# Patient Record
Sex: Male | Born: 1950 | Race: White | Hispanic: No | Marital: Married
Health system: Southern US, Community
[De-identification: ages and names within clinical notes are randomized; demographics above are authoritative.]

---

## 2016-02-23 ENCOUNTER — Encounter (HOSPITAL_COMMUNITY): Payer: Self-pay | Admitting: *Deleted

## 2016-02-23 ENCOUNTER — Emergency Department (HOSPITAL_COMMUNITY): Payer: BLUE CROSS/BLUE SHIELD

## 2016-02-23 ENCOUNTER — Emergency Department (HOSPITAL_COMMUNITY)
Admission: EM | Admit: 2016-02-23 | Discharge: 2016-02-23 | Disposition: A | Discharge status: Home / Self Care | Payer: BLUE CROSS/BLUE SHIELD | Attending: Emergency Medicine | Admitting: Emergency Medicine

## 2016-02-23 DIAGNOSIS — R079 Chest pain, unspecified: Secondary | ICD-10-CM | POA: Insufficient documentation

## 2016-02-23 DIAGNOSIS — Z9861 Coronary angioplasty status: Secondary | ICD-10-CM | POA: Insufficient documentation

## 2016-02-23 DIAGNOSIS — K5900 Constipation, unspecified: Secondary | ICD-10-CM | POA: Insufficient documentation

## 2016-02-23 DIAGNOSIS — R0989 Other specified symptoms and signs involving the circulatory and respiratory systems: Secondary | ICD-10-CM | POA: Insufficient documentation

## 2016-02-23 DIAGNOSIS — E119 Type 2 diabetes mellitus without complications: Secondary | ICD-10-CM | POA: Insufficient documentation

## 2016-02-23 DIAGNOSIS — E875 Hyperkalemia: Secondary | ICD-10-CM

## 2016-02-23 DIAGNOSIS — I10 Essential (primary) hypertension: Secondary | ICD-10-CM

## 2016-02-23 DIAGNOSIS — R109 Unspecified abdominal pain: Secondary | ICD-10-CM | POA: Insufficient documentation

## 2016-02-23 LAB — CBC
HCT: 40.8 % (ref 39.0–52.0)
Hemoglobin: 13.9 g/dL (ref 13.0–17.0)
MCH: 27.4 pg (ref 26.0–34.0)
MCHC: 34.1 g/dL (ref 30.0–36.0)
MCV: 80.3 fL (ref 78.0–100.0)
PLATELETS: 233 10*3/uL (ref 150–400)
RBC: 5.08 MIL/uL (ref 4.22–5.81)
RDW: 12.8 % (ref 11.5–15.5)
WBC: 10.1 10*3/uL (ref 4.0–10.5)

## 2016-02-23 LAB — BASIC METABOLIC PANEL
Anion gap: 13 (ref 5–15)
BUN: 26 mg/dL — ABNORMAL HIGH (ref 6–20)
CHLORIDE: 104 mmol/L (ref 101–111)
CO2: 22 mmol/L (ref 22–32)
CREATININE: 1.45 mg/dL — AB (ref 0.61–1.24)
Calcium: 9 mg/dL (ref 8.9–10.3)
GFR, EST AFRICAN AMERICAN: 58 mL/min — AB (ref >60)
GFR, EST NON AFRICAN AMERICAN: 50 mL/min — AB (ref >60)
Glucose, Bld: 141 mg/dL — ABNORMAL HIGH (ref 65–99)
POTASSIUM: 5.2 mmol/L — AB (ref 3.5–5.1)
SODIUM: 139 mmol/L (ref 135–145)

## 2016-02-23 LAB — I-STAT TROPONIN, ED: Troponin i, poc: 0 ng/mL (ref 0.00–0.08)

## 2016-02-23 MED ORDER — GI COCKTAIL ~~LOC~~
30.0000 mL | Freq: Once | ORAL | Status: AC
  Administered 2016-02-23: 30 mL via ORAL
  Filled 2016-02-23: qty 30

## 2016-02-23 MED ORDER — NITROGLYCERIN 0.4 MG SL SUBL: 0.4000 mg | SUBLINGUAL_TABLET | SUBLINGUAL | Status: AC | PRN

## 2016-02-23 NOTE — ED Notes (Signed)
Pt has a hx of HTN and states he intermittently becomes hypertensive. Pt has a reddened face, states he did take his medications today.

## 2016-02-23 NOTE — ED Provider Notes (Signed)
CSN: [id number]     Arrival date & time 02/23/16  1600 History   First MD Initiated Contact with Patient 02/23/16 1625     Chief Complaint  Patient presents with  . Hypertension     (Consider location/radiation/quality/duration/timing/severity/associated sxs/prior Treatment) HPI   Patient is a 65 year old male with history of hypertension, CABG x 5, DM, he presents to the emergency Department with complaints of elevated blood pressure and brief episodes of intermittent, stabbing chest pain, which she has had for several years.  Today he was driving a car this friend when he had 1-2 seconds of sharp left-sided chest pain that radiated to his back. His friend states he was red in the face and was concerned about his blood pressure. He was brought to the ER by EMS who documented blood pressure systolic 200.  The patient states that he took high-dose of aspirin today. He did not take his nitroglycerin for his pain, but believes that nitroglycerin is only used to lower his blood pressure, he does not seem to understand that is used for intermittent chest pain. He is on 3 blood pressure medications however he does not know the name of them. He is managed by Dr. Ann MakiParrish, cardiologist in [place]. He states that the pain that he experienced today is consistent with the intermittent pains he has had for several years. He reports that 2 weeks ago he had a negative treadmill stress test and negative carotid ultrasounds.  He denies any associated nausea, vomiting, diaphoresis, shortness of breath, near-syncope.  He reports he is compliant with his medications. Patient states that over the last 3 nights he has had worsening acid reflux, minimally improved by taking his wife's Prilosec.  He currently denies any chest pain while at rest.  No other acute complaints.  History reviewed. No pertinent past medical history. History reviewed. No pertinent past surgical history. History reviewed. No pertinent family  history. Social History  Substance Use Topics  . Smoking status: Unknown If Ever Smoked  . Smokeless tobacco: None  . Alcohol Use: None    Review of Systems  Constitutional: Negative for chills, diaphoresis, activity change and appetite change.  HENT: Negative.   Eyes: Negative.   Respiratory: Negative for cough, chest tightness and shortness of breath.   Cardiovascular: Positive for chest pain. Negative for palpitations and leg swelling.  Gastrointestinal: Positive for abdominal pain. Negative for nausea, vomiting, diarrhea, constipation and abdominal distention.  Genitourinary: Negative.   Musculoskeletal: Negative.   Skin: Negative.   Neurological: Negative.   All other systems reviewed and are negative.     Allergies  Review of patient's allergies indicates no known allergies.  Home Medications   Prior to Admission medications   Medication Sig Start Date End Date Taking? Authorizing Provider  nitroGLYCERIN (NITROSTAT) 0.4 MG SL tablet Place 1 tablet (0.4 mg total) under the tongue every 5 (five) minutes as needed for chest pain. 02/23/16   Danelle BerryLeisa Kristilyn Coltrane, PA-C   BP 139/76 mmHg  Pulse 64  Temp(Src) 97.9 F (36.6 C) (Oral)  Resp 12  SpO2 95% Physical Exam  Constitutional: He is oriented to person, place, and time. He appears well-developed and well-nourished. He is cooperative.  Non-toxic appearance. He does not have a sickly appearance. No distress.  HENT:  Head: Normocephalic and atraumatic.  Nose: Nose normal.  Mouth/Throat: Oropharynx is clear and moist. No oropharyngeal exudate.  Eyes: Conjunctivae and EOM are normal. Pupils are equal, round, and reactive to light. Right eye exhibits no discharge.  Left eye exhibits no discharge. No scleral icterus.  Neck: Trachea normal and normal range of motion. Neck supple. No JVD present. Carotid bruit is present. No tracheal deviation present. No thyromegaly present.  Cardiovascular: Normal rate, regular rhythm, normal heart  sounds and intact distal pulses.  Exam reveals no gallop and no friction rub.   No murmur heard. Pulmonary/Chest: Effort normal and breath sounds normal. No respiratory distress. He has no wheezes. He has no rales. He exhibits no tenderness.  Abdominal: Soft. Bowel sounds are normal. He exhibits no distension and no mass. There is no tenderness. There is no rebound and no guarding.  Musculoskeletal: Normal range of motion. He exhibits no edema or tenderness.  Lymphadenopathy:    He has no cervical adenopathy.  Neurological: He is alert and oriented to person, place, and time. He has normal reflexes. No cranial nerve deficit. He exhibits normal muscle tone. Coordination normal.  Skin: Skin is warm and dry. No rash noted. He is not diaphoretic. No erythema. No pallor.  Psychiatric: He has a normal mood and affect. His behavior is normal. Judgment and thought content normal.  Nursing note and vitals reviewed.   ED Course  Procedures (including critical care time) Labs Review Labs Reviewed  BASIC METABOLIC PANEL - Abnormal; Notable for the following:    Potassium 5.2 (*)    Glucose, Bld 141 (*)    BUN 26 (*)    Creatinine, Ser 1.45 (*)    GFR calc non Af Amer 50 (*)    GFR calc Af Amer 58 (*)    All other components within normal limits  CBC  I-STAT TROPOININ, ED    Imaging Review Dg Chest 2 View  02/23/2016  CLINICAL DATA:  History of hypertension, mid abdominal pain. History of CABG. EXAM: CHEST  2 VIEW COMPARISON:  Chest x-ray dated 10/31/2006. FINDINGS: Median sternotomy wires appear intact and stable alignment. Heart size is normal. Overall cardiomediastinal silhouette is stable in size and configuration. Lungs are clear. No evidence of pneumonia. No pleural effusion. No acute- appearing osseous abnormality. IMPRESSION: No active cardiopulmonary disease. Electronically Signed   By: Bary Richard M.D.   On: 02/23/2016 17:12   Dg Abd 2 Views  02/23/2016  CLINICAL DATA:  Abdominal  pain and constipation for 2 days. EXAM: ABDOMEN - 2 VIEW COMPARISON:  None. FINDINGS: The bowel gas pattern is normal. There is no evidence of free air. No radio-opaque calculi seen. Moderate colonic stool noted. Surgical clips seen in both right and left upper quadrants. Diffuse atherosclerotic calcification iliac arteries noted. IMPRESSION: Unremarkable bowel gas pattern.  No acute findings. Electronically Signed   By: Myles Rosenthal M.D.   On: 02/23/2016 17:09   I have personally reviewed and evaluated these images and lab results as part of my medical decision-making.   EKG Interpretation   Date/Time:  Tuesday February 23 2016 16:49:39 EDT Ventricular Rate:  66 PR Interval:  162 QRS Duration: 90 QT Interval:  396 QTC Calculation: 415 R Axis:   77 Text Interpretation:  Normal sinus rhythm Normal ECG Confirmed by RAY MD,  Duwayne Heck [phone]) on 02/23/2016 5:29:29 PM      MDM   65 year old male with history of CABG 5, hypertension, hyperlipidemia, diabetes, presents to the emergency department with concerns of elevated blood pressures. He has been experiencing recent spikes in his blood pressure, and has been working with cardiology and his PCP manage blood pressures.  Upon arrival to the ER systolic was 178, patient denied  any chest pain.  He reports a several year history of intermittent pains in his chest that lasts 1-2 seconds, described as sharp. He also reported 3 days of acid reflux symptoms and recent difficulty urinating. Urinary frequency, treated with Cipro by his PCP, improving on medications.  Cardiac workup was initiated.  Troponin was negative, EKG was normal sinus rhythm.  Lab work was pertinent for elevated BUN and creatinine, mild hyperkalemia 5.2.  There are no results within distal electronic medical record to compare to however the wife was able to pull up recent labs from January which showed BUN of 21, serum creatinine of 1.22 and potassium 4.8.  Patient recently had a stress  test 2 weeks ago which he states is negative. He also had carotid ultrasound done, secondary to carotid bruits, which was also negative. The only recent change to his medication was increased carvedilol dose which occurred approximately 2 weeks ago.  Patient states he been eating and drinking normally, no recent illness, no N, V, D.  He has been constipated recently but feels it is improving with OTC stool softener. He denies any shortness of breath, exertional chest pain, palpitations, lower extremity edema, orthopnea, PND.    He is currently keeping a log of his blood pressures to review with his doctors. Blood pressure normalized without any intervention in the ER, last reading 139/76.  He was personally seen and evaluated by Dr. Rosalia Hammers, until he is stable for discharge home without any changes from blood pressure medications as he is currently working with his physicians on managing it.  He was instructed to follow up tomorrow with PCP, increase fluid intake meanwhile, and have his potassium and kidney function rechecked.  His wife and friend were in the room and strict return precautions were reviewed, they'll verbalize understanding.  Final diagnoses:  Essential hypertension  Chest pain, unspecified chest pain type  Hyperkalemia      Danelle Berry, PA-C 02/24/16 0158  Margarita Grizzle, MD 02/25/16 1229

## 2016-02-23 NOTE — ED Notes (Signed)
Patient transported to X-ray 

## 2016-02-23 NOTE — ED Notes (Signed)
Pt is in stable condition upon d/c and ambulates from ED. 

## 2016-02-23 NOTE — Discharge Instructions (Signed)
Hypertension Hypertension, commonly called high blood pressure, is when the force of blood pumping through your arteries is too strong. Your arteries are the blood vessels that carry blood from your heart throughout your body. A blood pressure reading consists of a higher number over a lower number, such as 110/72. The higher number (systolic) is the pressure inside your arteries when your heart pumps. The lower number (diastolic) is the pressure inside your arteries when your heart relaxes. Ideally you want your blood pressure below 120/80. Hypertension forces your heart to work harder to pump blood. Your arteries may become narrow or stiff. Having untreated or uncontrolled hypertension can cause heart attack, stroke, kidney disease, and other problems. RISK FACTORS Some risk factors for high blood pressure are controllable. Others are not.  Risk factors you cannot control include:   Race. You may be at higher risk if you are African American.  Age. Risk increases with age.  Gender. Men are at higher risk than women before age 45 years. After age 65, women are at higher risk than men. Risk factors you can control include:  Not getting enough exercise or physical activity.  Being overweight.  Getting too much fat, sugar, calories, or salt in your diet.  Drinking too much alcohol. SIGNS AND SYMPTOMS Hypertension does not usually cause signs or symptoms. Extremely high blood pressure (hypertensive crisis) may cause headache, anxiety, shortness of breath, and nosebleed. DIAGNOSIS To check if you have hypertension, your health care provider will measure your blood pressure while you are seated, with your arm held at the level of your heart. It should be measured at least twice using the same arm. Certain conditions can cause a difference in blood pressure between your right and left arms. A blood pressure reading that is higher than normal on one occasion does not mean that you need treatment. If  it is not clear whether you have high blood pressure, you may be asked to return on a different day to have your blood pressure checked again. Or, you may be asked to monitor your blood pressure at home for 1 or more weeks. TREATMENT Treating high blood pressure includes making lifestyle changes and possibly taking medicine. Living a healthy lifestyle can help lower high blood pressure. You may need to change some of your habits. Lifestyle changes may include:  Following the DASH diet. This diet is high in fruits, vegetables, and whole grains. It is low in salt, red meat, and added sugars.  Keep your sodium intake below 2,300 mg per day.  Getting at least 30-45 minutes of aerobic exercise at least 4 times per week.  Losing weight if necessary.  Not smoking.  Limiting alcoholic beverages.  Learning ways to reduce stress. Your health care provider may prescribe medicine if lifestyle changes are not enough to get your blood pressure under control, and if one of the following is true:  You are 18-59 years of age and your systolic blood pressure is above 140.  You are 60 years of age or older, and your systolic blood pressure is above 150.  Your diastolic blood pressure is above 90.  You have diabetes, and your systolic blood pressure is over 140 or your diastolic blood pressure is over 90.  You have kidney disease and your blood pressure is above 140/90.  You have heart disease and your blood pressure is above 140/90. Your personal target blood pressure may vary depending on your medical conditions, your age, and other factors. HOME CARE INSTRUCTIONS    Have your blood pressure rechecked as directed by your health care provider.   Take medicines only as directed by your health care provider. Follow the directions carefully. Blood pressure medicines must be taken as prescribed. The medicine does not work as well when you skip doses. Skipping doses also puts you at risk for  problems.  Do not smoke.   Monitor your blood pressure at home as directed by your health care provider. SEEK MEDICAL CARE IF:   You think you are having a reaction to medicines taken.  You have recurrent headaches or feel dizzy.  You have swelling in your ankles.  You have trouble with your vision. SEEK IMMEDIATE MEDICAL CARE IF:  You develop a severe headache or confusion.  You have unusual weakness, numbness, or feel faint.  You have severe chest or abdominal pain.  You vo Hyperkalemia Hyperkalemia is when you have too much potassium in your blood. Potassium is normally removed (excreted) from your body by your kidneys. If there is too much potassium in your blood, it can affect your heart's ability to function.  CAUSES  Hyperkalemia may be caused by:  Taking in too much potassium. You can do this by: Using salt substitutes. They contain large amounts of potassium. Taking potassium supplements. Eating foods high in potassium. Excreting too little potassium. This can happen if: Your kidneys are not working properly. Kidney (renal) disease, including short- or long-term renal failure, is a very common cause of hyperkalemia. You are taking medicines that lower your excretion of potassium. You have Addison disease. You have a urinary tract blockage, such as kidney stones. You are on treatment to mechanically clean your blood (dialysis) and you skip a treatment. Releasing a high amount of potassium from your cells into your blood. This can happen with: Injury to muscles (rhabdomyolysis) or other tissues. Most potassium is stored in your muscles. Severe burns or infections. Acidic blood plasma (acidosis). Acidosis can result from many diseases, such as uncontrolled diabetes. RISK FACTORS The most common risk factor of hyperkalemia is kidney disease. Other risk factors of hyperkalemia include: Addison disease. This is a condition where your glands do not produce enough  hormones. Alcoholism or heavy drug use.  Using certain blood pressure medicines, such as angiotensin-converting enzyme (ACE) inhibitors, angiotensin II receptor blockers (ARBs), or potassium-sparing diuretics such as spironolactone. Severe injury or burn. SIGNS AND SYMPTOMS  Oftentimes, there are no signs or symptoms of hyperkalemia. However, when your potassium level becomes high enough, you may experience symptoms such as: Irregular or very slow heartbeat. Nausea. Fatigue. Tingling of the skin or numbness of the hands or feet. Muscle weakness. Fatigue. Not being able to move (paralysis). You may not have any symptoms of hyperkalemia.  DIAGNOSIS  Hyperkalemia may be diagnosed by: Physical exam. Blood tests. ECG (electrocardiogram). Discussion of prescription and non-prescription drug use. TREATMENT  Treatment for hyperkalemia is often directed at the underlying cause. In some instances, treatment may include:  Insulin. Glucose (sugar) and water solution given through a vein (intravenous or IV). Dialysis. Medicines to remove the potassium from your body. Medicines to move calcium from your bloodstream into your tissues. HOME CARE INSTRUCTIONS  Take medicines only as directed by your health care provider. Do not take any supplements, natural products, herbs, or vitamins without reviewing them with your health care provider. Certain supplements and natural food products can have high amounts of potassium. Limit your alcohol intake as directed by your health care provider. Stop illegal drug use. If you  need help quitting, ask your health care provider. Keep all follow-up visits as directed by your health care provider. This is important. If you have kidney disease, you may need to follow a low potassium diet. A dietitian can help educate you on low potassium foods. SEEK MEDICAL CARE IF:  You notice an irregular or very slow heartbeat. You feel light-headed. You feel weak. You  are nauseous. You have tingling or numbness in your hands or feet. SEEK IMMEDIATE MEDICAL CARE IF:  You have shortness of breath. You have chest pain or discomfort. You pass out. You have muscle paralysis. MAKE SURE YOU:  Understand these instructions. Will watch your condition. Will get help right away if you are not doing well or get worse.   This information is not intended to replace advice given to you by your health care provider. Make sure you discuss any questions you have with your health care provider.   Document Released: 11/18/2002 Document Revised: 12/19/2014 Document Reviewed: 03/05/2014 Elsevier Interactive Patient Education 2016 [employer]  mit repeatedly.  You have trouble breathing. MAKE SURE YOU:   Understand these instructions.  Will watch your condition.  Will get help right away if you are not doing well or get worse.   This information is not intended to replace advice given to you by your health care provider. Make sure you discuss any questions you have with your health care provider.   Document Released: 11/28/2005 Document Revised: 04/14/2015 Document Reviewed: 09/20/2013 Elsevier Interactive Patient Education [employer]

## 2017-05-17 IMAGING — DX DG CHEST 2V
2 series · 2 of 2 positions shown · non-contrast
Comparison: Chest x-ray dated 10/31/2006.

CLINICAL DATA: History of hypertension, mid abdominal pain. History
of CABG.

EXAM:
CHEST  2 VIEW

[chest pa]
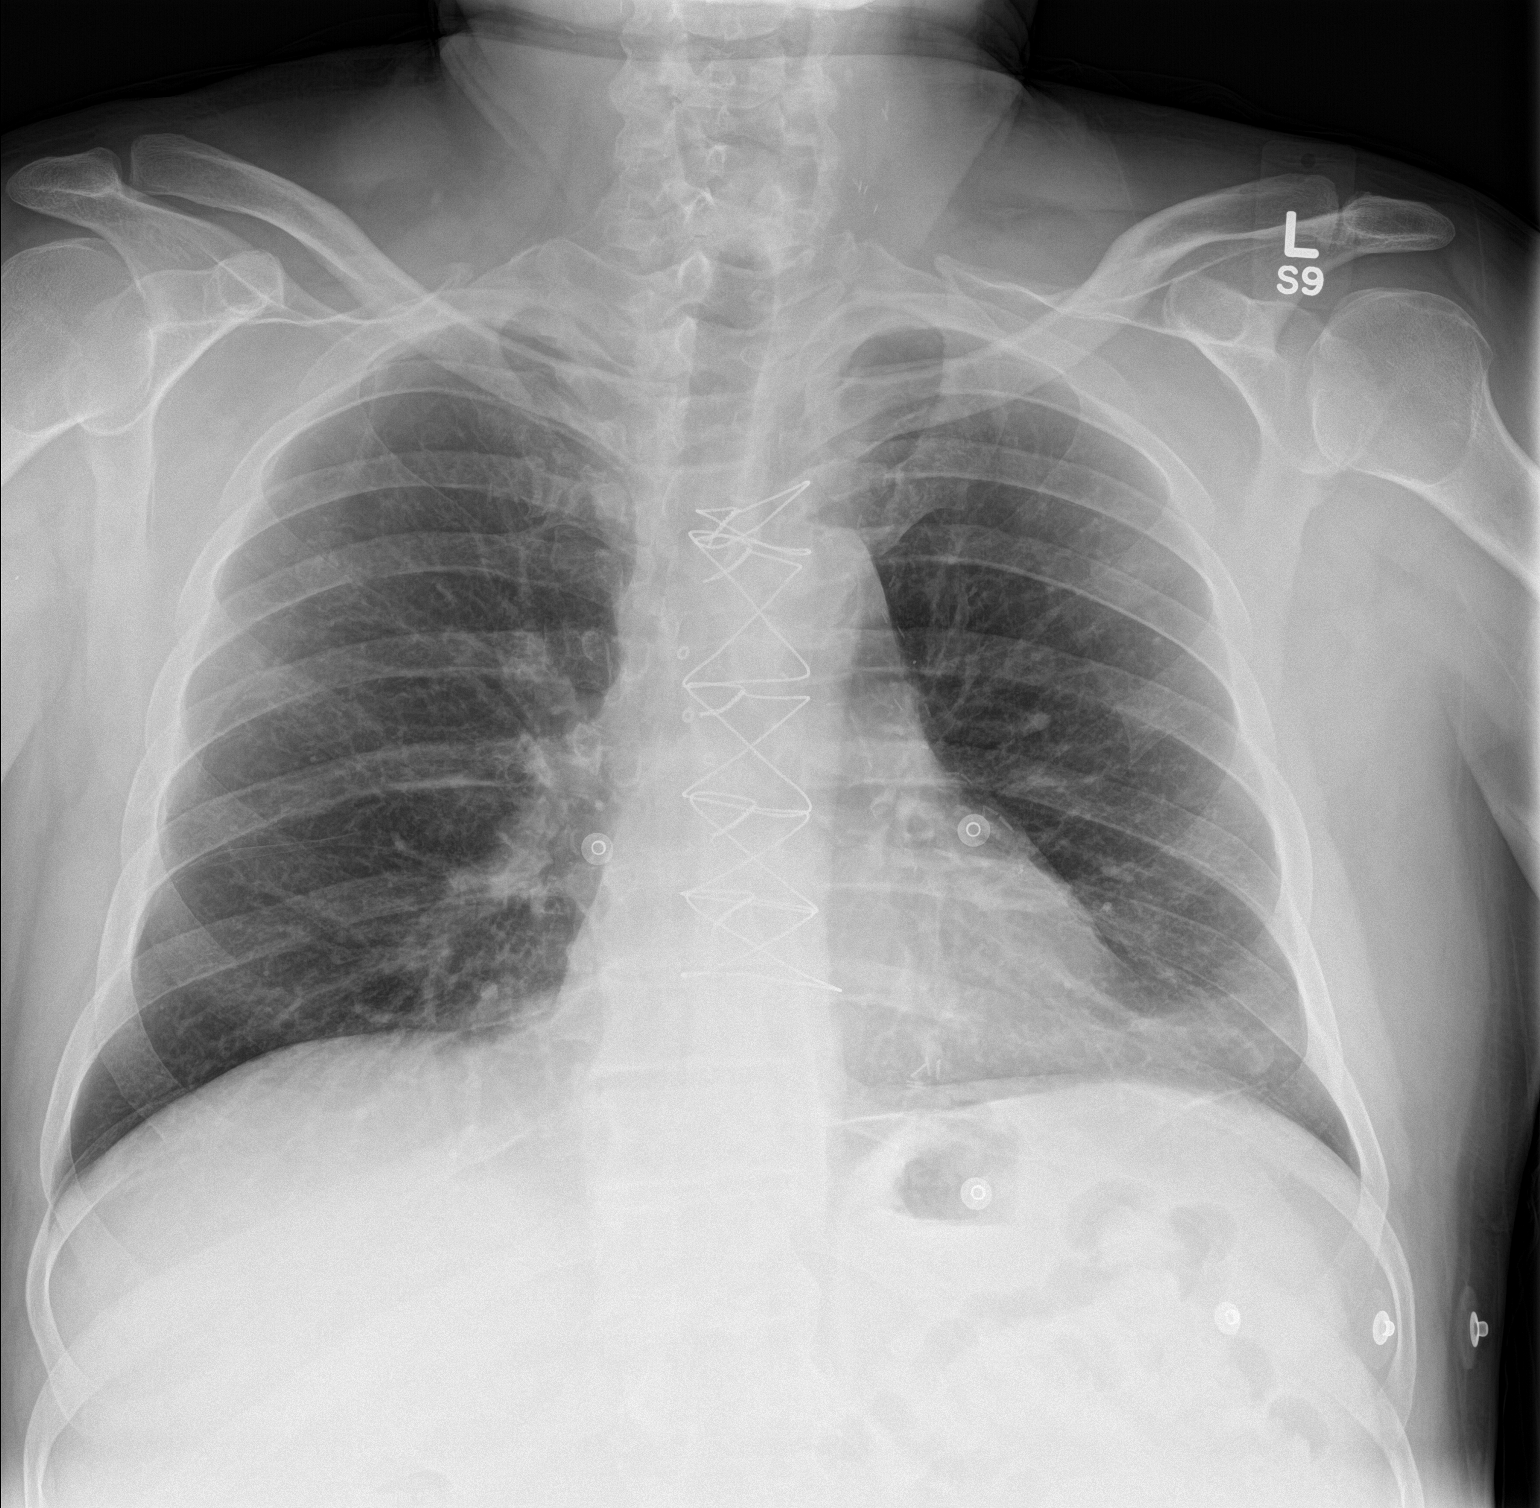

[chest lat]
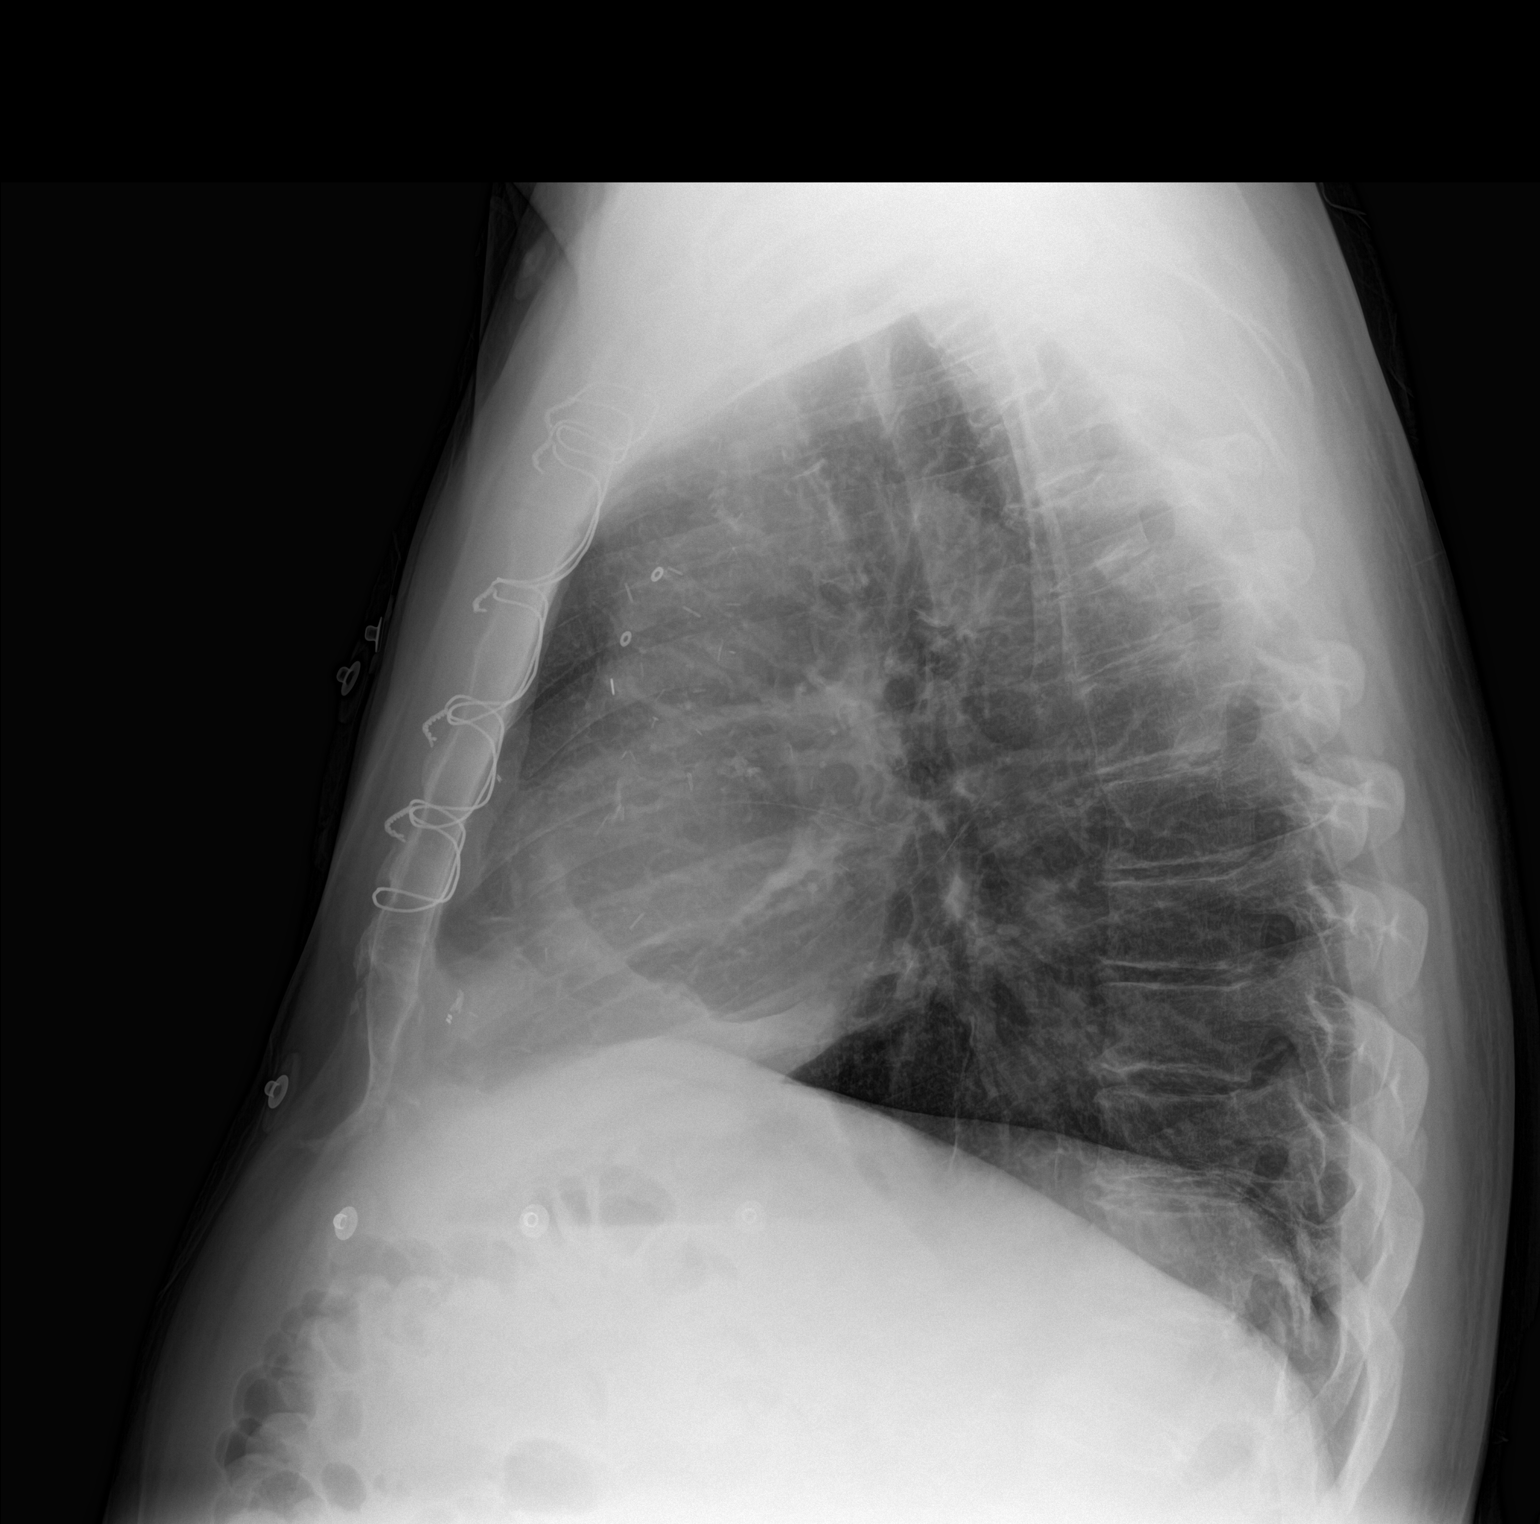

[2 of 2 positions shown; findings below may reference images not displayed]

FINDINGS: Median sternotomy wires appear intact and stable alignment. Heart
size is normal. Overall cardiomediastinal silhouette is stable in
size and configuration. Lungs are clear. No evidence of pneumonia.
No pleural effusion. No acute- appearing osseous abnormality.
IMPRESSION: No active cardiopulmonary disease.
